# Patient Record
Sex: Male | Born: 1970 | Race: White | Hispanic: No | Marital: Married | Smoking: Never smoker
Health system: Southern US, Community
[De-identification: ages and names within clinical notes are randomized; demographics above are authoritative.]

## PROBLEM LIST (undated history)

## (undated) DIAGNOSIS — E7800 Pure hypercholesterolemia, unspecified: Secondary | ICD-10-CM

---

## 2015-07-05 ENCOUNTER — Other Ambulatory Visit: Payer: Self-pay

## 2015-07-05 ENCOUNTER — Emergency Department (HOSPITAL_COMMUNITY): Payer: Self-pay

## 2015-07-05 ENCOUNTER — Emergency Department (HOSPITAL_COMMUNITY)
Admission: EM | Admit: 2015-07-05 | Discharge: 2015-07-05 | Disposition: A | Discharge status: Home / Self Care | Payer: BLUE CROSS/BLUE SHIELD | Attending: Emergency Medicine | Admitting: Emergency Medicine

## 2015-07-05 ENCOUNTER — Encounter (HOSPITAL_COMMUNITY): Payer: Self-pay | Admitting: Nurse Practitioner

## 2015-07-05 DIAGNOSIS — R0602 Shortness of breath: Secondary | ICD-10-CM | POA: Insufficient documentation

## 2015-07-05 DIAGNOSIS — R002 Palpitations: Secondary | ICD-10-CM | POA: Insufficient documentation

## 2015-07-05 DIAGNOSIS — Z79899 Other long term (current) drug therapy: Secondary | ICD-10-CM | POA: Insufficient documentation

## 2015-07-05 DIAGNOSIS — E7800 Pure hypercholesterolemia, unspecified: Secondary | ICD-10-CM | POA: Insufficient documentation

## 2015-07-05 DIAGNOSIS — R0789 Other chest pain: Secondary | ICD-10-CM | POA: Insufficient documentation

## 2015-07-05 DIAGNOSIS — Z7982 Long term (current) use of aspirin: Secondary | ICD-10-CM | POA: Insufficient documentation

## 2015-07-05 DIAGNOSIS — F419 Anxiety disorder, unspecified: Secondary | ICD-10-CM | POA: Insufficient documentation

## 2015-07-05 DIAGNOSIS — R42 Dizziness and giddiness: Secondary | ICD-10-CM | POA: Insufficient documentation

## 2015-07-05 HISTORY — DX: Pure hypercholesterolemia, unspecified: E78.00

## 2015-07-05 LAB — CBC WITH DIFFERENTIAL/PLATELET
BASOS ABS: 0 10*3/uL (ref 0.0–0.1)
Basophils Relative: 0 %
EOS PCT: 3 %
Eosinophils Absolute: 0.1 10*3/uL (ref 0.0–0.7)
HEMATOCRIT: 42.1 % (ref 39.0–52.0)
HEMOGLOBIN: 14.8 g/dL (ref 13.0–17.0)
LYMPHS ABS: 1.2 10*3/uL (ref 0.7–4.0)
LYMPHS PCT: 31 %
MCH: 31 pg (ref 26.0–34.0)
MCHC: 35.2 g/dL (ref 30.0–36.0)
MCV: 88.1 fL (ref 78.0–100.0)
Monocytes Absolute: 0.4 10*3/uL (ref 0.1–1.0)
Monocytes Relative: 9 %
NEUTROS ABS: 2.2 10*3/uL (ref 1.7–7.7)
NEUTROS PCT: 57 %
PLATELETS: 173 10*3/uL (ref 150–400)
RBC: 4.78 MIL/uL (ref 4.22–5.81)
RDW: 11.8 % (ref 11.5–15.5)
WBC: 3.8 10*3/uL — AB (ref 4.0–10.5)

## 2015-07-05 LAB — BASIC METABOLIC PANEL
ANION GAP: 8 (ref 5–15)
BUN: 11 mg/dL (ref 6–20)
CHLORIDE: 108 mmol/L (ref 101–111)
CO2: 25 mmol/L (ref 22–32)
Calcium: 8.8 mg/dL — ABNORMAL LOW (ref 8.9–10.3)
Creatinine, Ser: 1.03 mg/dL (ref 0.61–1.24)
GFR calc Af Amer: >60 mL/min (ref >60)
GLUCOSE: 96 mg/dL (ref 65–99)
POTASSIUM: 3.7 mmol/L (ref 3.5–5.1)
Sodium: 141 mmol/L (ref 135–145)

## 2015-07-05 LAB — I-STAT TROPONIN, ED: Troponin i, poc: 0 ng/mL (ref 0.00–0.08)

## 2015-07-05 LAB — BRAIN NATRIURETIC PEPTIDE: B Natriuretic Peptide: 32.1 pg/mL (ref 0.0–100.0)

## 2015-07-05 NOTE — ED Notes (Signed)
Pt called ems today for SOB after drinking a cup of coffee and walking up a flight of stairs. Felt lightheaded and like he could not catch his breath at this time, and felt a "tightness" in his chest. He had a similar episode on 12/23 and went to an urgent care who referred him to ED for follow up but he didn't seek further care at that time. He denies cardiac hx, he is A&Ox4, resp e/u. 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

## 2015-07-05 NOTE — Discharge Instructions (Signed)
Generalized Anxiety Disorder °Generalized anxiety disorder (GAD) is a mental disorder. It interferes with life functions, including relationships, work, and school. °GAD is different from normal anxiety, which everyone experiences at some point in their lives in response to specific life events and activities. Normal anxiety actually helps us prepare for and get through these life events and activities. Normal anxiety goes away after the event or activity is over.  °GAD causes anxiety that is not necessarily related to specific events or activities. It also causes excess anxiety in proportion to specific events or activities. The anxiety associated with GAD is also difficult to control. GAD can vary from mild to severe. People with severe GAD can have intense waves of anxiety with physical symptoms (panic attacks).  °SYMPTOMS °The anxiety and worry associated with GAD are difficult to control. This anxiety and worry are related to many life events and activities and also occur more days than not for 6 months or longer. People with GAD also have three or more of the following symptoms (one or more in children): °· Restlessness.   °· Fatigue. °· Difficulty concentrating.   °· Irritability. °· Muscle tension. °· Difficulty sleeping or unsatisfying sleep. °DIAGNOSIS °GAD is diagnosed through an assessment by your health care provider. Your health care provider will ask you questions about your mood, physical symptoms, and events in your life. Your health care provider may ask you about your medical history and use of alcohol or drugs, including prescription medicines. Your health care provider may also do a physical exam and blood tests. Certain medical conditions and the use of certain substances can cause symptoms similar to those associated with GAD. Your health care provider may refer you to a mental health specialist for further evaluation. °TREATMENT °The following therapies are usually used to treat GAD:   °· Medication. Antidepressant medication usually is prescribed for long-term daily control. Antianxiety medicines may be added in severe cases, especially when panic attacks occur.   °· Talk therapy (psychotherapy). Certain types of talk therapy can be helpful in treating GAD by providing support, education, and guidance. A form of talk therapy called cognitive behavioral therapy can teach you healthy ways to think about and react to daily life events and activities. °· Stress management techniques. These include yoga, meditation, and exercise and can be very helpful when they are practiced regularly. °A mental health specialist can help determine which treatment is best for you. Some people see improvement with one therapy. However, other people require a combination of therapies. °  °This information is not intended to replace advice given to you by your health care provider. Make sure you discuss any questions you have with your health care provider. °  °Document Released: 10/22/2012 Document Revised: 07/18/2014 Document Reviewed: 10/22/2012 °Elsevier Interactive Patient Education ©2016 [employer] °Nonspecific Chest Pain  °Chest pain can be caused by many different conditions. There is always a chance that your pain could be related to something serious, such as a heart attack or a blood clot in your lungs. Chest pain can also be caused by conditions that are not life-threatening. If you have chest pain, it is very important to follow up with your health care provider. °CAUSES  °Chest pain can be caused by: °· Heartburn. °· Pneumonia or bronchitis. °· Anxiety or stress. °· Inflammation around your heart (pericarditis) or lung (pleuritis or pleurisy). °· A blood clot in your lung. °· A collapsed lung (pneumothorax). It can develop suddenly on its own (spontaneous pneumothorax) or from trauma to the chest. °·   Shingles infection (varicella-zoster virus). °· Heart attack. °· Damage to the bones, muscles, and  cartilage that make up your chest wall. This can include: °· Bruised bones due to injury. °· Strained muscles or cartilage due to frequent or repeated coughing or overwork. °· Fracture to one or more ribs. °· Sore cartilage due to inflammation (costochondritis). °RISK FACTORS  °Risk factors for chest pain may include: °· Activities that increase your risk for trauma or injury to your chest. °· Respiratory infections or conditions that cause frequent coughing. °· Medical conditions or overeating that can cause heartburn. °· Heart disease or family history of heart disease. °· Conditions or health behaviors that increase your risk of developing a blood clot. °· Having had chicken pox (varicella zoster). °SIGNS AND SYMPTOMS °Chest pain can feel like: °· Burning or tingling on the surface of your chest or deep in your chest. °· Crushing, pressure, aching, or squeezing pain. °· Dull or sharp pain that is worse when you move, cough, or take a deep breath. °· Pain that is also felt in your back, neck, shoulder, or arm, or pain that spreads to any of these areas. °Your chest pain may come and go, or it may stay constant. °DIAGNOSIS °Lab tests or other studies may be needed to find the cause of your pain. Your health care provider may have you take a test called an ambulatory ECG (electrocardiogram). An ECG records your heartbeat patterns at the time the test is performed. You may also have other tests, such as: °· Transthoracic echocardiogram (TTE). During echocardiography, sound waves are used to create a picture of all of the heart structures and to look at how blood flows through your heart. °· Transesophageal echocardiogram (TEE). This is a more advanced imaging test that obtains images from inside your body. It allows your health care provider to see your heart in finer detail. °· Cardiac monitoring. This allows your health care provider to monitor your heart rate and rhythm in real time. °· Holter monitor. This is a  portable device that records your heartbeat and can help to diagnose abnormal heartbeats. It allows your health care provider to track your heart activity for several days, if needed. °· Stress tests. These can be done through exercise or by taking medicine that makes your heart beat more quickly. °· Blood tests. °· Imaging tests. °TREATMENT  °Your treatment depends on what is causing your chest pain. Treatment may include: °· Medicines. These may include: °¨ Acid blockers for heartburn. °¨ Anti-inflammatory medicine. °¨ Pain medicine for inflammatory conditions. °¨ Antibiotic medicine, if an infection is present. °¨ Medicines to dissolve blood clots. °¨ Medicines to treat coronary artery disease. °· Supportive care for conditions that do not require medicines. This may include: °¨ Resting. °¨ Applying heat or cold packs to injured areas. °¨ Limiting activities until pain decreases. °HOME CARE INSTRUCTIONS °· If you were prescribed an antibiotic medicine, finish it all even if you start to feel better. °· Avoid any activities that bring on chest pain. °· Do not use any tobacco products, including cigarettes, chewing tobacco, or electronic cigarettes. If you need help quitting, ask your health care provider. °· Do not drink alcohol. °· Take medicines only as directed by your health care provider. °· Keep all follow-up visits as directed by your health care provider. This is important. This includes any further testing if your chest pain does not go away. °· If heartburn is the cause for your chest pain, you may be told   to keep your head raised (elevated) while sleeping. This reduces the chance that acid will go from your stomach into your esophagus. °· Make lifestyle changes as directed by your health care provider. These may include: °¨ Getting regular exercise. Ask your health care provider to suggest some activities that are safe for you. °¨ Eating a heart-healthy diet. A registered dietitian can help you to learn  healthy eating options. °¨ Maintaining a healthy weight. °¨ Managing diabetes, if necessary. °¨ Reducing stress. °SEEK MEDICAL CARE IF: °· Your chest pain does not go away after treatment. °· You have a rash with blisters on your chest. °· You have a fever. °SEEK IMMEDIATE MEDICAL CARE IF:  °· Your chest pain is worse. °· You have an increasing cough, or you cough up blood. °· You have severe abdominal pain. °· You have severe weakness. °· You faint. °· You have chills. °· You have sudden, unexplained chest discomfort. °· You have sudden, unexplained discomfort in your arms, back, neck, or jaw. °· You have shortness of breath at any time. °· You suddenly start to sweat, or your skin gets clammy. °· You feel nauseous or you vomit. °· You suddenly feel light-headed or dizzy. °· Your heart begins to beat quickly, or it feels like it is skipping beats. °These symptoms may represent a serious problem that is an emergency. Do not wait to see if the symptoms will go away. Get medical help right away. Call your local emergency services (911 in the [place] Do not drive yourself to the hospital. °  °This information is not intended to replace advice given to you by your health care provider. Make sure you discuss any questions you have with your health care provider. °  °Document Released: 04/06/2005 Document Revised: 07/18/2014 Document Reviewed: 01/31/2014 °Elsevier Interactive Patient Education ©2016 [employer] ° °

## 2015-07-05 NOTE — ED Provider Notes (Signed)
CSN: [id number]     Arrival date & time 07/05/15  1757 History   First MD Initiated Contact with Patient 07/05/15 1758     Chief Complaint  Patient presents with  . Chest Pain    Brian Bradshaw is a 44 y.o. male with a history of hyperlipidemia who presents to the emergency department complaining of intermittent chest tightness for the past 3 days. Patient reports today after walking up some stairs around 4 PM he started having chest tightness with associated lightheadedness and shortness of breath. Reports currently all the symptoms have resolved. He reports feeling anxious and having increased stress. He denies any chest pain or coughing. Reports having palpitations earlier which have resolved. He denies personal or close family history of MI, DVT or PEs. He reports he is a former smoker. He reports his hyperlipidemia is controlled with diet. The patient denies fevers, chills, abdominal pain, nausea, vomiting, diarrhea, leg pain, leg swelling, cough, syncope, headache, or changes to his vision.  (Consider location/radiation/quality/duration/timing/severity/associated sxs/prior Treatment) HPI  Past Medical History  Diagnosis Date  . Hypercholesteremia    History reviewed. No pertinent past surgical history. History reviewed. No pertinent family history. Social History  Substance Use Topics  . Smoking status: Never Smoker   . Smokeless tobacco: None  . Alcohol Use: Yes    Review of Systems  Constitutional: Negative for fever and chills.  HENT: Negative for congestion and sore throat.   Eyes: Negative for visual disturbance.  Respiratory: Positive for chest tightness and shortness of breath. Negative for cough.   Cardiovascular: Positive for palpitations. Negative for chest pain and leg swelling.  Gastrointestinal: Negative for nausea, vomiting, abdominal pain and diarrhea.  Genitourinary: Negative for dysuria.  Musculoskeletal: Negative for back pain and neck pain.  Skin:  Negative for rash.  Neurological: Positive for light-headedness. Negative for dizziness, syncope, weakness, numbness and headaches.      Allergies  Review of patient's allergies indicates no known allergies.  Home Medications   Prior to Admission medications   Medication Sig Start Date End Date Taking? Authorizing Provider  aspirin EC 81 MG tablet Take 81 mg by mouth daily.   Yes Historical Provider, MD  Omega-3 Fatty Acids (FISH OIL PO) Take 1 capsule by mouth daily.   Yes Historical Provider, MD   BP 106/75 mmHg  Pulse 64  Temp(Src) 98.6 F (37 C) (Oral)  Resp 12  SpO2 97% Physical Exam  Constitutional: He is oriented to person, place, and time. He appears well-developed and well-nourished. No distress.  Nontoxic-appearing. Patient appears very anxious.  HENT:  Head: Normocephalic and atraumatic.  Right Ear: External ear normal.  Left Ear: External ear normal.  Mouth/Throat: Oropharynx is clear and moist. No oropharyngeal exudate.  Eyes: Conjunctivae are normal. Pupils are equal, round, and reactive to light. Right eye exhibits no discharge. Left eye exhibits no discharge.  Neck: Normal range of motion. Neck supple. No JVD present. No tracheal deviation present.  Cardiovascular: Normal rate, regular rhythm, normal heart sounds and intact distal pulses.  Exam reveals no gallop and no friction rub.   No murmur heard. Bilateral radial, posterior tibialis and dorsalis pedis pulses are intact.    Pulmonary/Chest: Effort normal and breath sounds normal. No respiratory distress. He has no wheezes. He has no rales. He exhibits no tenderness.  Lungs are clear to auscultation bilaterally.  Abdominal: Soft. Bowel sounds are normal. He exhibits no distension. There is no tenderness. There is no guarding.  Musculoskeletal: He exhibits no  edema or tenderness.  No lower extremity edema or tenderness.  Lymphadenopathy:    He has no cervical adenopathy.  Neurological: He is alert and  oriented to person, place, and time. Coordination normal.  Skin: Skin is warm and dry. No rash noted. He is not diaphoretic. No erythema. No pallor.  Psychiatric: His behavior is normal. His mood appears anxious.  The patient appears very anxious. He endorses feeling anxious.  Nursing note and vitals reviewed.   ED Course  Procedures (including critical care time) Labs Review Labs Reviewed  CBC WITH DIFFERENTIAL/PLATELET - Abnormal; Notable for the following:    WBC 3.8 (*)    All other components within normal limits  BASIC METABOLIC PANEL - Abnormal; Notable for the following:    Calcium 8.8 (*)    All other components within normal limits  BRAIN NATRIURETIC PEPTIDE  I-STAT TROPOININ, ED    Imaging Review Dg Chest 2 View  07/05/2015  CLINICAL DATA:  Short of breath for 1 day EXAM: CHEST  2 VIEW COMPARISON:  None. FINDINGS: Normal mediastinum and cardiac silhouette. Normal pulmonary vasculature. No evidence of effusion, infiltrate, or pneumothorax. No acute bony abnormality. IMPRESSION: No acute cardiopulmonary process. Electronically Signed   By: Genevive Bi M.D.   On: 07/05/2015 19:23   I have personally reviewed and evaluated these images and lab results as part of my medical decision-making.   EKG Interpretation   Date/Time:  Sunday July 05 2015 17:50:55 EST Ventricular Rate:  69 PR Interval:  154 QRS Duration: 88 QT Interval:  400 QTC Calculation: 428 R Axis:   78 Text Interpretation:  Sinus rhythm with marked sinus arrhythmia Otherwise  normal ECG No previous ECGs available Confirmed by YAO  MD, DAVID ([id number])  on 07/05/2015 9:05:57 PM      Filed Vitals:   07/05/15 1945 07/05/15 2015 07/05/15 2030 07/05/15 2045  BP: 111/74 110/70 104/74 106/75  Pulse: 63 63 70 64  Temp:      TempSrc:      Resp: SpO2: 96% 97% 96% 97%     MDM   Final diagnoses:  Chest tightness  Anxiety   This is a 44 y.o. male with a history of hyperlipidemia who  presents to the emergency department complaining of intermittent chest tightness for the past 3 days. Patient reports today after walking up some stairs around 4 PM he started having chest tightness with associated lightheadedness and shortness of breath. Reports currently all the symptoms have resolved. He reports feeling anxious and having increased stress. He denies any chest pain or coughing. Reports having palpitations earlier which have resolved. Patient appears very anxious and endorses anxiety. Patient presented with chest pain to the ED. Patient is to be discharged with recommendation to follow up with PCP in regards to today's hospital visit. Chest pain is not likely of cardiac or pulmonary etiology due to presentation, perc negative, VSS, no tracheal deviation, no JVD or new murmur, RRR, breath sounds equal bilaterally, EKG without acute abnormalities, negative troponin, and negative CXR. HEART score is 2. Patient has been advised to return to the ED if chest pain becomes exertional, associated with diaphoresis or nausea, radiates to left jaw/arm, worsens or becomes concerning in any way. Patient appears reliable for follow up and is agreeable to discharge. Patient reports he is had 3 episodes before where he became anxious about an event and believes this is contributed to his anxiety. I encouraged him to talk to his primary  care provider about seeing a therapist and/or psychiatrist for his anxiety. I advised the patient to follow-up with their primary care provider this week. I advised the patient to return to the emergency department with new or worsening symptoms or new concerns. The patient verbalized understanding and agreement with plan.         Everlene FarrierWilliam Burnett Spray, PA-C 07/05/15 2117  Richardean Canalavid H Yao, MD 07/05/15 [phone]

## 2016-05-22 IMAGING — CR DG CHEST 2V
2 series · 2 of 2 positions shown · non-contrast
Comparison: None.

CLINICAL DATA: Short of breath for 1 day

EXAM:
CHEST  2 VIEW

[chest pa]
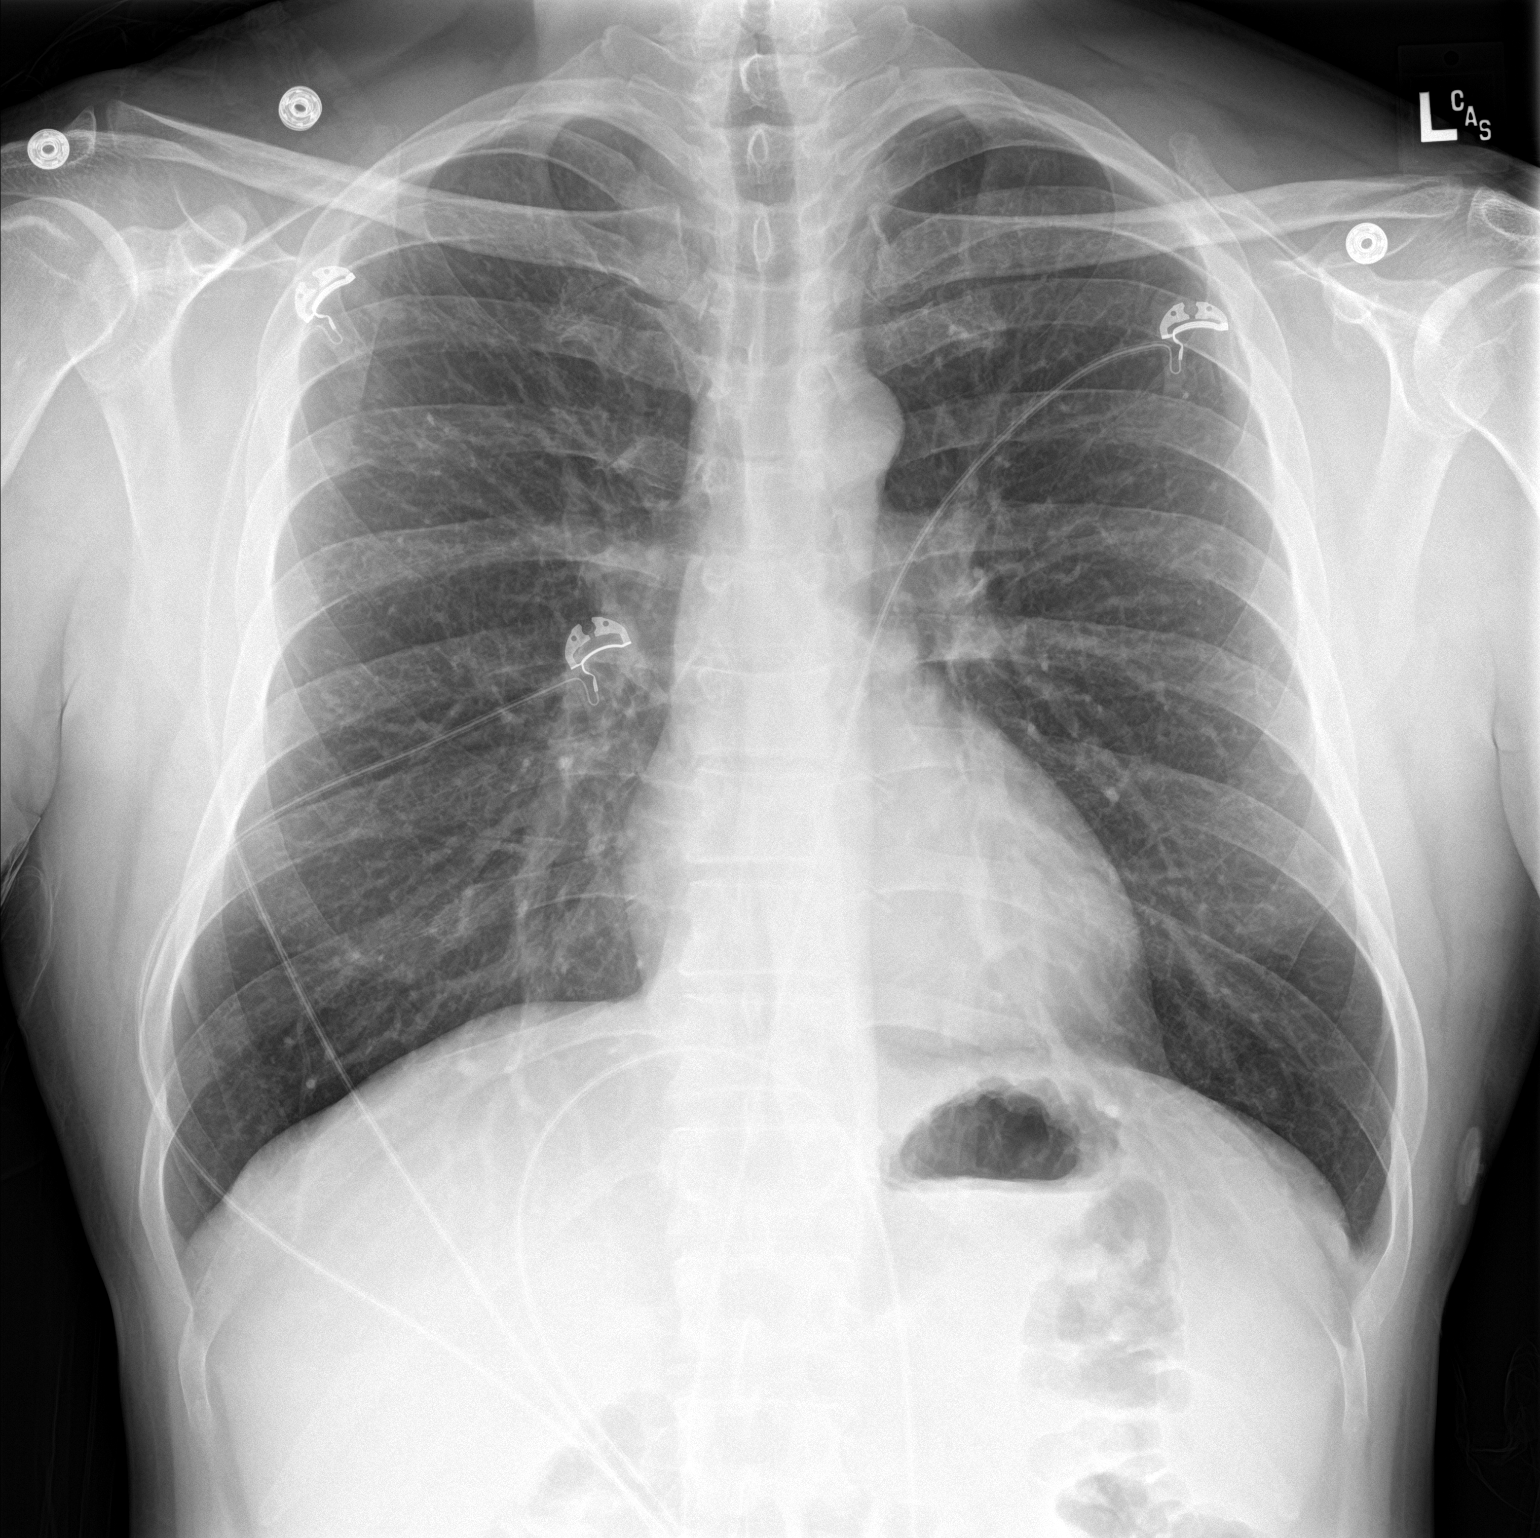

[chest lat]
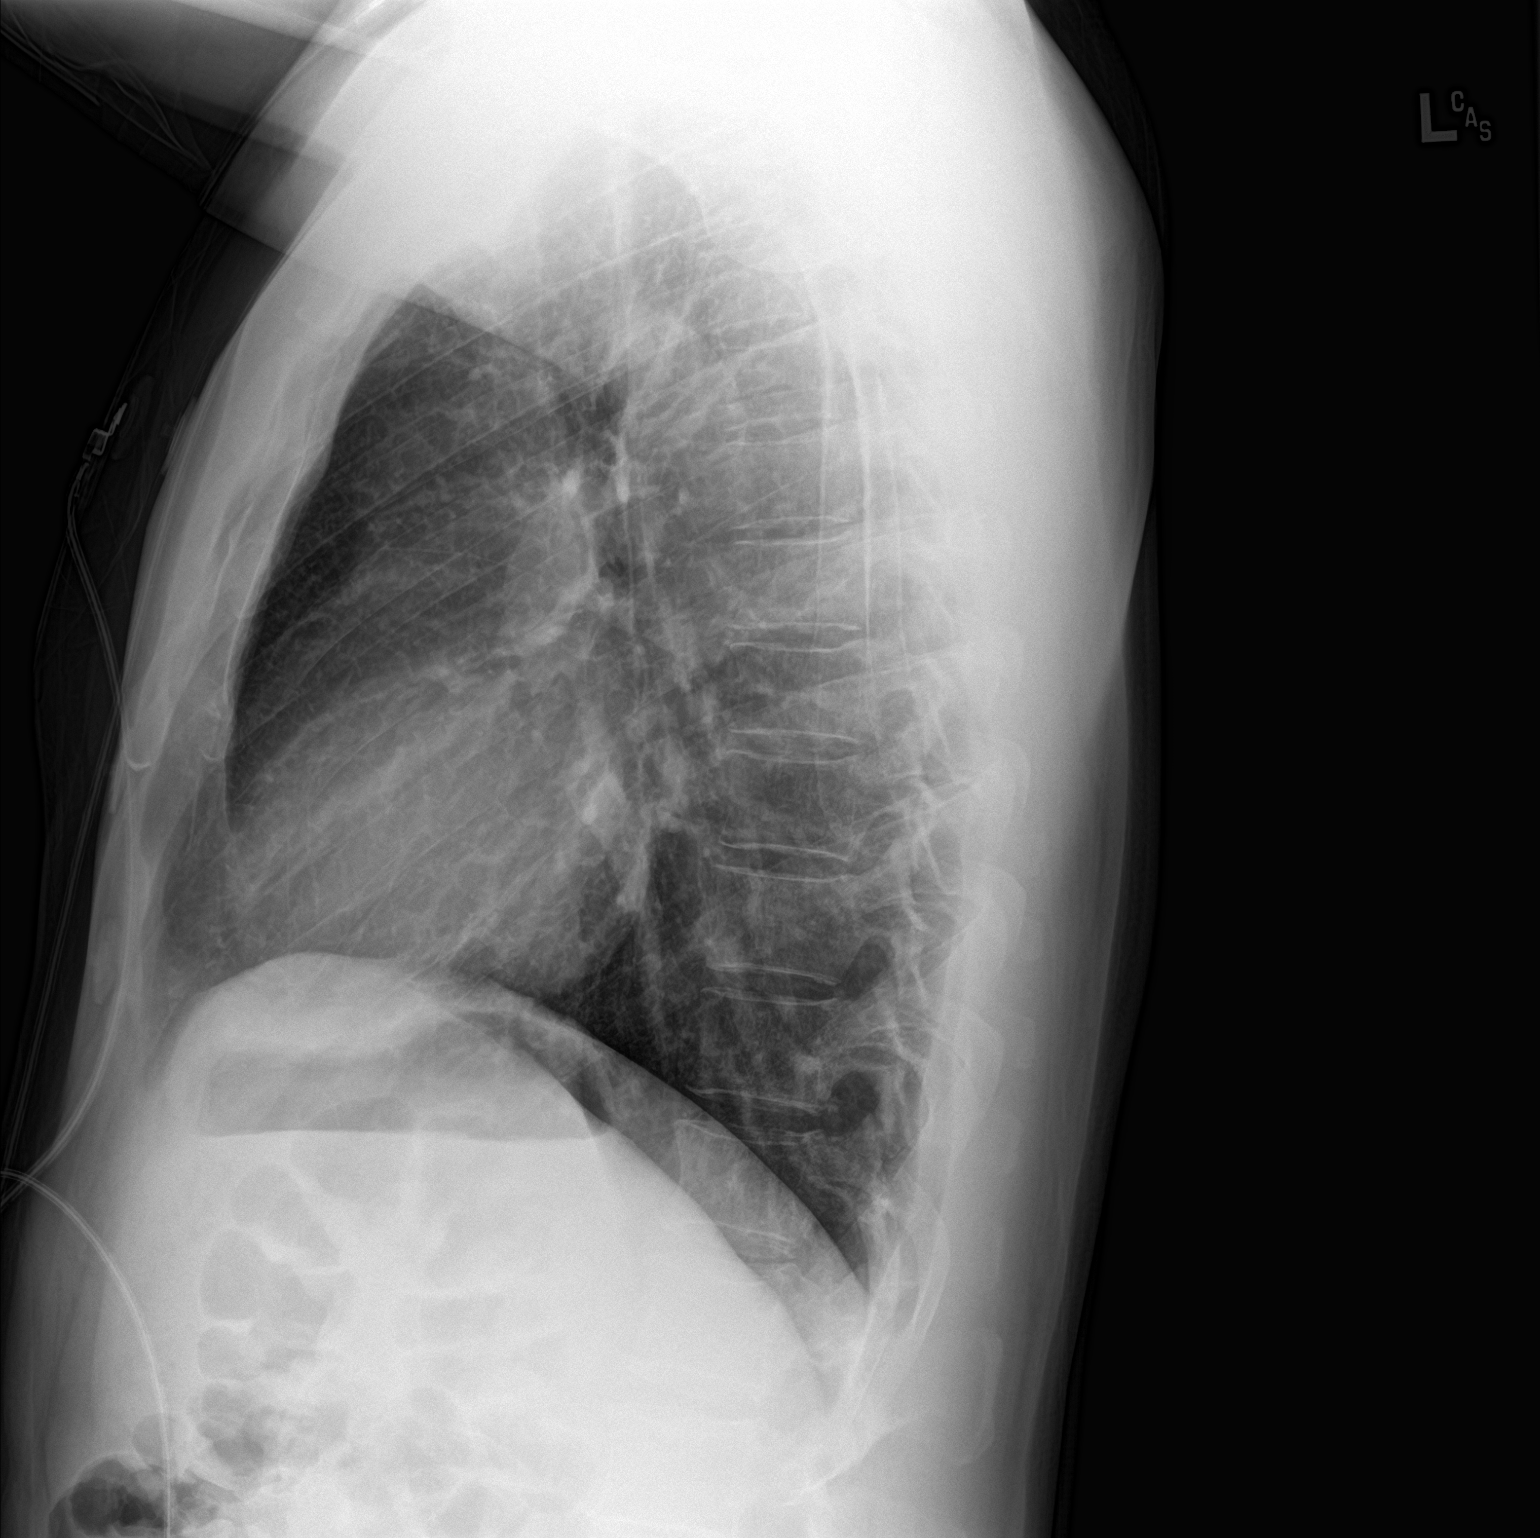

[2 of 2 positions shown; findings below may reference images not displayed]

FINDINGS: Normal mediastinum and cardiac silhouette. Normal pulmonary
vasculature. No evidence of effusion, infiltrate, or pneumothorax.
No acute bony abnormality.
IMPRESSION: No acute cardiopulmonary process.
